# Patient Record
Sex: Male | Born: 2000 | Race: White | Hispanic: No | Marital: Single | Smoking: Never smoker
Health system: Southern US, Community
[De-identification: ages and names within clinical notes are randomized; demographics above are authoritative.]

## PROBLEM LIST (undated history)

## (undated) DIAGNOSIS — J302 Other seasonal allergic rhinitis: Secondary | ICD-10-CM

## (undated) HISTORY — PX: WISDOM TOOTH EXTRACTION: SHX21

## (undated) HISTORY — DX: Other seasonal allergic rhinitis: J30.2

---
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
## 2020-12-17 DIAGNOSIS — U071 COVID-19: Secondary | ICD-10-CM

## 2020-12-17 HISTORY — DX: COVID-19: U07.1

## 2021-01-02 ENCOUNTER — Telehealth: Payer: Self-pay | Admitting: Family Medicine

## 2021-01-02 NOTE — Telephone Encounter (Signed)
Patients father called and asked for him to be established with DR G. / Dr Darnell Level agreed to see him. EM

## 2021-01-03 DIAGNOSIS — T148XXA Other injury of unspecified body region, initial encounter: Secondary | ICD-10-CM | POA: Diagnosis not present

## 2021-01-03 DIAGNOSIS — L089 Local infection of the skin and subcutaneous tissue, unspecified: Secondary | ICD-10-CM | POA: Diagnosis not present

## 2021-01-05 DIAGNOSIS — L03211 Cellulitis of face: Secondary | ICD-10-CM | POA: Diagnosis not present

## 2021-01-10 DIAGNOSIS — U071 COVID-19: Secondary | ICD-10-CM | POA: Diagnosis not present

## 2021-01-30 ENCOUNTER — Other Ambulatory Visit: Payer: Self-pay

## 2021-01-30 ENCOUNTER — Ambulatory Visit (INDEPENDENT_AMBULATORY_CARE_PROVIDER_SITE_OTHER): Payer: BC Managed Care – PPO | Admitting: Family Medicine

## 2021-01-30 ENCOUNTER — Encounter: Payer: Self-pay | Admitting: Family Medicine

## 2021-01-30 DIAGNOSIS — D229 Melanocytic nevi, unspecified: Secondary | ICD-10-CM

## 2021-01-30 DIAGNOSIS — L03211 Cellulitis of face: Secondary | ICD-10-CM

## 2021-01-30 DIAGNOSIS — E669 Obesity, unspecified: Secondary | ICD-10-CM | POA: Diagnosis not present

## 2021-01-30 DIAGNOSIS — R21 Rash and other nonspecific skin eruption: Secondary | ICD-10-CM | POA: Diagnosis not present

## 2021-01-30 NOTE — Patient Instructions (Signed)
For rash to face - suspicious for seborrheic dermatitis - treat with cortizone-10 steroid topically once daily for max 10 days and may use clotrimazole (lotrimin) twice daily as needed on face around nose.  May continue head&shoulders or selsun blue or nizoral shampoo for scalp.  Congratulations on weight loss! Keep up the good work!  For bump next to eye, use warm compresses - likely just scar nodule that should get better with time.  Return for fasting labs and afterwards for physical over next 1-2 months.

## 2021-01-30 NOTE — Progress Notes (Signed)
Patient ID: Matthew Friedman, male    DOB: 2000/12/15, 20 y.o.   MRN: [id number]  This visit was conducted in person.  BP 110/64 (BP Location: Left Arm, Patient Position: Sitting, Cuff Size: Large)   Pulse 80   Temp 98.1 F (36.7 C) (Temporal)   Ht 5\' 8"  (1.727 m)   Wt 225 lb 6 oz (102.2 kg)   SpO2 97%   BMI 34.27 kg/m    CC: new pt to establish care Subjective:   HPI: Matthew Friedman is a 20 y.o. male presenting on 01/30/2021 for Establish Care   Recently seen at [facility] for left facial cellulitis laterally near eye. Treated with doxycycline 100mg  bid 10d course and mupirocin. Symptoms have improved however notes ongoing induration to skin at lateral eye.   COVID infection late last month, treated with paxlovid antiviral med. Symptoms fully resolved.   Weight loss - has lost 13 lbs this summer. Healthy diet changes (cut out sweet tea), using apple watch for goal >10k steps/day.   New freckle on nose - planning to see derm.  H/o seborrheic dermatitis around nose and to scalp - previously treated with ?ketoconazole shampoo with benefit. Currently using equate brand head and shoulders anti-dandruff shampoo.   No recent CPE.  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 Interim medical history since our last visit reviewed. Allergies and medications reviewed and updated. Outpatient Medications Prior to Visit  Medication Sig Dispense Refill   Multiple Vitamin (MULTIVITAMIN) capsule Take 1 capsule by mouth daily.     No facility-administered medications prior to visit.     Per HPI unless specifically indicated in ROS section below Review of Systems  Objective:  BP 110/64 (BP Location:  Left Arm, Patient Position: Sitting, Cuff Size: Large)   Pulse 80   Temp 98.1 F (36.7 C) (Temporal)   Ht 5\' 8"  (1.727 m)   Wt 225 lb 6 oz (102.2 kg)   SpO2 97%   BMI 34.27 kg/m   Wt Readings from Last 3 Encounters:  01/30/21 225 lb 6 oz (102.2 kg)      Physical Exam Vitals and nursing note reviewed.  Constitutional:      Appearance: Normal appearance. He is obese. He is not ill-appearing.  HENT:     Head: Normocephalic and atraumatic.  Eyes:     General: No scleral icterus.       Right eye: No discharge.        Left eye: No discharge.     Extraocular Movements: Extraocular movements intact.     Conjunctiva/sclera: Conjunctivae normal.     Pupils: Pupils are equal, round, and reactive to light.     Comments: Small nodule lateral to L eye  Cardiovascular:     Rate and Rhythm: Normal rate and regular rhythm.     Pulses: Normal pulses.     Heart sounds: Normal heart sounds. No murmur heard. Pulmonary:     Effort: Pulmonary effort is normal. No respiratory distress.     Breath sounds: Normal breath sounds. No wheezing, rhonchi or rales.  Musculoskeletal:        General: Normal range of motion.     Right lower leg: No edema.  Left lower leg: No edema.  Skin:    General: Skin is warm and dry.     Capillary Refill: Capillary refill takes less than 2 seconds.     Findings: Erythema and rash present. Rash is scaling.     Comments:  Scaling erythema to midface around nose  Few typical nevi on face including nose, one darker nevus to R cheek  Neurological:     Mental Status: He is alert.  Psychiatric:        Mood and Affect: Mood normal.        Behavior: Behavior normal.      Results for orders placed or performed in visit on 05/24/19  Novel Coronavirus, NAA ([facility])   Specimen: Oropharyngeal(OP) collection in vial transport medium   OROPHARYNGEA  TESTING  Result Value Ref Range   SARS-CoV-2, NAA Not Detected Not Detected    Assessment & Plan:  This visit  occurred during the SARS-CoV-2 public health emergency.  Safety protocols were in place, including screening questions prior to the visit, additional usage of staff PPE, and extensive cleaning of exam room while observing appropriate contact time as indicated for disinfecting solutions.   Problem List Items Addressed This Visit     Rash of face    Facial rash suspicious for seborrheic dermatitis.  Discussed home treatment - rec start withtopical OTC cortizone-10 once daily x10 days max (Discussed steroid precautions) along with lotrimin antifungal cream BID PRN. Discussed maintenance use of selsun blue or other anti-dandruff shampoo. He does have upcoming dermatology eval.        Multiple nevi    Multiple typical appearing nevi on face, one darker pigmented nevus R cheek - advised he check with dermatologist.        Obesity, Class II, BMI 35-39.9, no comorbidity    Reviewed healthy diet and lifestyle choices he has started implementing - congratulated on 13 lb weight loss to date, encouraged continued weight loss efforts through small steps he can build on. He is motivated to continue this.        Facial cellulitis    Lateral to left eye - significantly improved. Discussed warm compresses to small residual inflammatory nodule. No signs of ongoing infection.          Meds ordered this encounter  Medications   Hydrocortisone-Aloe Vera (CORTIZONE 10 PLUS) 1 % CREA    Sig: Apply to affected area on face once daily for 7-10 days at a time    Dispense:  14 g    Refill:  0   clotrimazole (LOTRIMIN AF) 1 % cream    Sig: Apply 1 application topically 2 (two) times daily.    Dispense:  30 g    Refill:  0    No orders of the defined types were placed in this encounter.   Patient Instructions  For rash to face - suspicious for seborrheic dermatitis - treat with cortizone-10 steroid topically once daily for max 10 days and may use clotrimazole (lotrimin) twice daily as needed on face  around nose.  May continue head&shoulders or selsun blue or nizoral shampoo for scalp.  Congratulations on weight loss! Keep up the good work!  For bump next to eye, use warm compresses - likely just scar nodule that should get better with time.  Return for fasting labs and afterwards for physical over next 1-2 months.   Follow up plan: Return in about 6 weeks (around 03/13/2021), or if symptoms worsen or fail to improve, for  annual exam, prior fasting for blood work.  Ria Bush, MD

## 2021-01-31 ENCOUNTER — Encounter: Payer: Self-pay | Admitting: Family Medicine

## 2021-01-31 DIAGNOSIS — L219 Seborrheic dermatitis, unspecified: Secondary | ICD-10-CM | POA: Insufficient documentation

## 2021-01-31 DIAGNOSIS — L03211 Cellulitis of face: Secondary | ICD-10-CM | POA: Insufficient documentation

## 2021-01-31 DIAGNOSIS — E669 Obesity, unspecified: Secondary | ICD-10-CM | POA: Insufficient documentation

## 2021-01-31 DIAGNOSIS — D229 Melanocytic nevi, unspecified: Secondary | ICD-10-CM | POA: Insufficient documentation

## 2021-01-31 DIAGNOSIS — R21 Rash and other nonspecific skin eruption: Secondary | ICD-10-CM | POA: Insufficient documentation

## 2021-01-31 MED ORDER — CLOTRIMAZOLE 1 % EX CREA: 1.0000 "application " | TOPICAL_CREAM | Freq: Two times a day (BID) | CUTANEOUS | 0 refills | Status: AC

## 2021-01-31 MED ORDER — CORTIZONE-10 PLUS 1 % EX CREA: TOPICAL_CREAM | CUTANEOUS | 0 refills | Status: AC

## 2021-01-31 NOTE — Assessment & Plan Note (Signed)
Multiple typical appearing nevi on face, one darker pigmented nevus R cheek - advised he check with dermatologist.

## 2021-01-31 NOTE — Assessment & Plan Note (Addendum)
Facial rash suspicious for seborrheic dermatitis.  Discussed home treatment - rec start withtopical OTC cortizone-10 once daily x10 days max (Discussed steroid precautions) along with lotrimin antifungal cream BID PRN. Discussed maintenance use of selsun blue or other anti-dandruff shampoo. He does have upcoming dermatology eval.

## 2021-01-31 NOTE — Assessment & Plan Note (Signed)
Reviewed healthy diet and lifestyle choices he has started implementing - congratulated on 13 lb weight loss to date, encouraged continued weight loss efforts through small steps he can build on. He is motivated to continue this.

## 2021-01-31 NOTE — Assessment & Plan Note (Signed)
Lateral to left eye - significantly improved. Discussed warm compresses to small residual inflammatory nodule. No signs of ongoing infection.

## 2021-03-01 ENCOUNTER — Other Ambulatory Visit: Payer: Self-pay | Admitting: Family Medicine

## 2021-03-01 DIAGNOSIS — Z131 Encounter for screening for diabetes mellitus: Secondary | ICD-10-CM

## 2021-03-01 DIAGNOSIS — Z13220 Encounter for screening for lipoid disorders: Secondary | ICD-10-CM

## 2021-03-06 ENCOUNTER — Other Ambulatory Visit (INDEPENDENT_AMBULATORY_CARE_PROVIDER_SITE_OTHER): Payer: BC Managed Care – PPO

## 2021-03-06 ENCOUNTER — Other Ambulatory Visit: Payer: Self-pay

## 2021-03-06 DIAGNOSIS — Z131 Encounter for screening for diabetes mellitus: Secondary | ICD-10-CM

## 2021-03-06 DIAGNOSIS — Z13220 Encounter for screening for lipoid disorders: Secondary | ICD-10-CM

## 2021-03-06 LAB — BASIC METABOLIC PANEL
BUN: 15 mg/dL (ref 6–23)
CO2: 26 mEq/L (ref 19–32)
Calcium: 9.5 mg/dL (ref 8.4–10.5)
Chloride: 106 mEq/L (ref 96–112)
Creatinine, Ser: 0.83 mg/dL (ref 0.40–1.50)
GFR: 126 mL/min (ref >60.00)
Glucose, Bld: 85 mg/dL (ref 70–99)
Potassium: 4.5 mEq/L (ref 3.5–5.1)
Sodium: 140 mEq/L (ref 135–145)

## 2021-03-06 LAB — LIPID PANEL
Cholesterol: 155 mg/dL (ref 0–200)
HDL: 41.3 mg/dL (ref >39.00)
LDL Cholesterol: 92 mg/dL (ref 0–99)
NonHDL: 113.77
Total CHOL/HDL Ratio: 4
Triglycerides: 111 mg/dL (ref 0.0–149.0)
VLDL: 22.2 mg/dL (ref 0.0–40.0)

## 2021-03-13 ENCOUNTER — Encounter: Payer: Self-pay | Admitting: Family Medicine

## 2021-03-13 ENCOUNTER — Ambulatory Visit (INDEPENDENT_AMBULATORY_CARE_PROVIDER_SITE_OTHER): Payer: BC Managed Care – PPO | Admitting: Family Medicine

## 2021-03-13 ENCOUNTER — Other Ambulatory Visit: Payer: Self-pay

## 2021-03-13 VITALS — BP 112/76 | HR 89 | Temp 98.0°F | Ht 68.0 in | Wt 215.9 lb

## 2021-03-13 DIAGNOSIS — L219 Seborrheic dermatitis, unspecified: Secondary | ICD-10-CM

## 2021-03-13 DIAGNOSIS — E669 Obesity, unspecified: Secondary | ICD-10-CM | POA: Diagnosis not present

## 2021-03-13 DIAGNOSIS — Z23 Encounter for immunization: Secondary | ICD-10-CM | POA: Diagnosis not present

## 2021-03-13 DIAGNOSIS — Z0000 Encounter for general adult medical examination without abnormal findings: Secondary | ICD-10-CM | POA: Insufficient documentation

## 2021-03-13 NOTE — Progress Notes (Signed)
Patient ID: Matthew Friedman, male    DOB: 2000-09-12, 20 y.o.   MRN: [id number]  This visit was conducted in person.  BP 112/76   Pulse 89   Temp 98 F (36.7 C) (Temporal)   Ht '5\' 8"'$  (1.727 m)   Wt 215 lb 14.4 oz (97.9 kg)   SpO2 98%   BMI 32.83 kg/m    CC: CPE Subjective:   HPI: Matthew Friedman is a 20 y.o. male presenting on 03/13/2021 for Annual Exam   10 lb weight loss in the past month! Notes increased walking since he's transitioned to college [name] Was working at [name] over the summer. Living on campus housing (apartment), has meal plan. Using My[employer]itness[employer] app since the beginning of summer. Total 25 lbs weight loss over this past summer. Works for campus housing.   Recent facial cellulitis treated with doxycycline with benefit.  Seborrheic dermatitis-  treating with OTC cortizone-10 daily for 10 days then lotrimin bid prn. Maintenance selsun blue. This is working well.   Planning study abroad next semester.   Preventative: Flu shot - hasn't received  [name] 09/2019, 10/2019, booster 05/2020  Tdap 2012. Update today  Seat belt use discussed Sunscreen use discussed. No changing moles on skin. 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 Interim medical history since our last visit reviewed. Allergies and medications reviewed and updated. Outpatient Medications Prior to Visit  Medication Sig Dispense Refill   clotrimazole (LOTRIMIN AF) 1 % cream Apply 1 application topically 2 (two) times daily. 30 g 0   Hydrocortisone-Aloe Vera (CORTIZONE 10 PLUS) 1 % CREA Apply to affected area  on face once daily for 7-10 days at a time 14 g 0   Multiple Vitamin (MULTIVITAMIN) capsule Take 1 capsule by mouth daily.     No facility-administered medications prior to visit.     Per HPI unless specifically indicated in ROS section below Review of Systems  Constitutional:  Negative for activity change, appetite change, chills, fatigue, fever and unexpected weight change.  HENT:  Negative for hearing loss.   Eyes:  Negative for visual disturbance.  Respiratory:  Negative for cough, chest tightness, shortness of breath and wheezing.   Cardiovascular:  Negative for chest pain, palpitations and leg swelling.  Gastrointestinal:  Negative for abdominal distention, abdominal pain, blood in stool, constipation, diarrhea, nausea and vomiting.  Genitourinary:  Negative for difficulty urinating and hematuria.  Musculoskeletal:  Negative for arthralgias, myalgias and neck pain.  Skin:  Negative for rash.  Neurological:  Negative for dizziness, seizures, syncope and headaches.  Hematological:  Negative for adenopathy. Does not bruise/bleed easily.  Psychiatric/Behavioral:  Negative for dysphoric mood. The patient is not nervous/anxious.    Objective:  BP 112/76   Pulse 89   Temp 98 F (36.7 C) (Temporal)   Ht '5\' 8"'$  (1.727 m)   Wt 215 lb 14.4 oz (97.9 kg)   SpO2 98%   BMI 32.83 kg/m   Wt Readings from Last 3 Encounters:  03/13/21 215 lb 14.4 oz (97.9 kg)  01/30/21 225 lb 6 oz (102.2 kg)  Physical Exam Vitals and nursing note reviewed.  Constitutional:      General: He is not in acute distress.    Appearance: Normal appearance. He is well-developed. He is not ill-appearing.  HENT:     Head: Normocephalic and atraumatic.     Right Ear: Hearing, tympanic membrane, ear canal and external ear normal.     Left Ear: Hearing, tympanic membrane, ear canal and external ear normal.  Eyes:     General: No scleral icterus.    Extraocular Movements: Extraocular movements intact.      Conjunctiva/sclera: Conjunctivae normal.     Pupils: Pupils are equal, round, and reactive to light.  Neck:     Thyroid: No thyroid mass or thyromegaly.  Cardiovascular:     Rate and Rhythm: Normal rate and regular rhythm.     Pulses: Normal pulses.          Radial pulses are 2+ on the right side and 2+ on the left side.     Heart sounds: Normal heart sounds. No murmur heard. Pulmonary:     Effort: Pulmonary effort is normal. No respiratory distress.     Breath sounds: Normal breath sounds. No wheezing, rhonchi or rales.  Abdominal:     General: Bowel sounds are normal. There is no distension.     Palpations: Abdomen is soft. There is no mass.     Tenderness: no abdominal tenderness There is no guarding or rebound.     Hernia: No hernia is present.  Musculoskeletal:        General: Normal range of motion.     Cervical back: Normal range of motion and neck supple.     Right lower leg: No edema.     Left lower leg: No edema.  Lymphadenopathy:     Cervical: No cervical adenopathy.  Skin:    General: Skin is warm and dry.     Findings: No rash.  Neurological:     General: No focal deficit present.     Mental Status: He is alert and oriented to person, place, and time.  Psychiatric:        Mood and Affect: Mood normal.        Behavior: Behavior normal.        Thought Content: Thought content normal.        Judgment: Judgment normal.      Results for orders placed or performed in visit on 123456  Basic metabolic panel  Result Value Ref Range   Sodium 140 135 - 145 mEq/L   Potassium 4.5 3.5 - 5.1 mEq/L   Chloride 106 96 - 112 mEq/L   CO2 26 19 - 32 mEq/L   Glucose, Bld 85 70 - 99 mg/dL   BUN 15 6 - 23 mg/dL   Creatinine, Ser 0.83 0.40 - 1.50 mg/dL   GFR 126.00 >60.00 mL/min   Calcium 9.5 8.4 - 10.5 mg/dL  Lipid panel  Result Value Ref Range   Cholesterol 155 0 - 200 mg/dL   Triglycerides 111.0 0.0 - 149.0 mg/dL   HDL 41.30 >39.00 mg/dL   VLDL 22.2 0.0 - 40.0 mg/dL    LDL Cholesterol 92 0 - 99 mg/dL   Total CHOL/HDL Ratio 4    NonHDL 113.77     Assessment & Plan:  This visit occurred during the SARS-CoV-2 public health emergency.  Safety protocols were in place, including screening questions prior to the visit, additional usage of staff PPE, and extensive cleaning of exam room  while observing appropriate contact time as indicated for disinfecting solutions.   Problem List Items Addressed This Visit     Seborrheic dermatitis    Good response to cortizone-10 + lotrimin. Discussed very sparing topical steroid on face and side effects to watch for. Use lotrimin or selsun blue for maintenance.       Obesity, Class I, BMI 30-34.9    Congratulated on weight loss to date.  Pt motivated to continue healthy diet and lifestyle choices for sustainable weight loss.       Health maintenance examination - Primary    Preventative protocols reviewed and updated unless pt declined. Discussed healthy diet and lifestyle.       Other Visit Diagnoses     Need for Tdap vaccination       Relevant Orders   Tdap vaccine greater than or equal to 7yo IM (Completed)        No orders of the defined types were placed in this encounter.  Orders Placed This Encounter  Procedures   Tdap vaccine greater than or equal to 7yo IM     Patient instructions: Use steroid (cortizone-10) sparingly. May use lotrimin or selsun blue shampoo as maintenance for seborrheic dermatitis.  You are doing well today - congratulations on weight loss! Return as needed or in 1-2 year for next physical.   Follow up plan: Return in about 1 year (around 03/13/2022) for annual exam, prior fasting for blood work.  Ria Bush, MD

## 2021-03-13 NOTE — Assessment & Plan Note (Signed)
Preventative protocols reviewed and updated unless pt declined. Discussed healthy diet and lifestyle.  

## 2021-03-13 NOTE — Assessment & Plan Note (Signed)
Congratulated on weight loss to date.  Pt motivated to continue healthy diet and lifestyle choices for sustainable weight loss.

## 2021-03-13 NOTE — Patient Instructions (Signed)
Use steroid (cortizone-10) sparingly. May use lotrimin or selsun blue shampoo as maintenance for seborrheic dermatitis.  You are doing well today - congratulations on weight loss! Return as needed or in 1-2 year for next physical.   Preventive Care 20-20 Years Old, Male Preventive care refers to lifestyle choices and visits with your health care provider that can promote health and wellness. At this stage in your life, you may start seeing a primary care physician instead of a pediatrician. It is important to take responsibility for your health and well-being. Preventive care for young adults includes: A yearly physical exam. This is also called an annual wellness visit. Regular dental and eye exams. Immunizations. Screening for certain conditions. Healthy lifestyle choices, such as: Eating a healthy diet. Getting regular exercise. Not using drugs or products that contain nicotine and tobacco. Limiting alcohol use. What can I expect for my preventive care visit? Physical exam Your health care provider may check your: Height and weight. These may be used to calculate your BMI (body mass index). BMI is a measurement that tells if you are at a healthy weight. Heart rate and blood pressure. Body temperature. Skin for abnormal spots. Counseling Your health care provider may ask you questions about your: Past medical problems. Family's medical history. Alcohol, tobacco, and drug use. Home life and relationship well-being. Access to firearms. Emotional well-being. Diet, exercise, and sleep habits. Sexual activity and sexual health. What immunizations do I need?  Vaccines are usually given at various ages, according to a schedule. Your health care provider will recommend vaccines for you based on your age, medicalhistory, and lifestyle or other factors, such as travel or where you work. What tests do I need? Blood tests Lipid and cholesterol levels. These may be checked every 5 years  starting at age 20. Hepatitis C test. Hepatitis B test. Screening Genital exam to check for testicular cancer or hernias. STD (sexually transmitted disease) testing, if you are at risk. Other tests Tuberculosis skin test. Vision and hearing tests. Skin exam. Talk with your health care provider about your test results, treatment options,and if necessary, the need for more tests. Follow these instructions at home: Eating and drinking  Eat a healthy diet that includes fresh fruits and vegetables, whole grains, lean protein, and low-fat dairy products. Drink enough fluid to keep your urine pale yellow. Do not drink alcohol if: Your health care provider tells you not to drink. You are under the legal drinking age. In the [place] the legal drinking age is 20. If you drink alcohol: Limit how much you use to 0-2 drinks a day. Be aware of how much alcohol is in your drink. In the [place] one drink equals one 12 oz bottle of beer (355 mL), one 5 oz glass of wine (148 mL), or one 1 oz glass of hard liquor (44 mL).  Lifestyle Take daily care of your teeth and gums. Brush your teeth every morning and night with fluoride toothpaste. Floss one time each day. Stay active. Exercise for at least 30 minutes 5 or more days of the week. Do not use any products that contain nicotine or tobacco, such as cigarettes, e-cigarettes, and chewing tobacco. If you need help quitting, ask your health care provider. Do not use drugs. If you are sexually active, practice safe sex. Use a condom or other form of protection to prevent STIs (sexually transmitted infections). Find healthy ways to cope with stress, such as: Meditation, yoga, or listening to music. Journaling. Talking to a  trusted person. Spending time with friends and family. Safety Always wear your seat belt while driving or riding in a vehicle. Do not drive: If you have been drinking alcohol. Do not ride with someone who has been drinking. When you are  tired or distracted. While texting. Wear a helmet and other protective equipment during sports activities. If you have firearms in your house, make sure you follow all gun safety procedures. Seek help if you have been bullied, physically abused, or sexually abused. Use the Internet responsibly to avoid dangers, such as online bullying and online sex predators. What's next? Go to your health care provider once a year for an annual wellness visit. Ask your health care provider how often you should have your eyes and teeth checked. Stay up to date on all vaccines. This information is not intended to replace advice given to you by your health care provider. Make sure you discuss any questions you have with your healthcare provider. Document Revised: 03/21/2019 Document Reviewed: 06/29/2018 Elsevier Patient Education  2022 [employer].

## 2021-03-13 NOTE — Assessment & Plan Note (Signed)
Good response to cortizone-10 + lotrimin. Discussed very sparing topical steroid on face and side effects to watch for. Use lotrimin or selsun blue for maintenance.

## 2021-05-08 DIAGNOSIS — R41840 Attention and concentration deficit: Secondary | ICD-10-CM | POA: Diagnosis not present

## 2022-03-06 ENCOUNTER — Other Ambulatory Visit: Payer: Self-pay | Admitting: Family Medicine

## 2022-03-06 DIAGNOSIS — E66811 Obesity, class 1: Secondary | ICD-10-CM

## 2022-03-06 DIAGNOSIS — E669 Obesity, unspecified: Secondary | ICD-10-CM

## 2022-03-06 DIAGNOSIS — Z1159 Encounter for screening for other viral diseases: Secondary | ICD-10-CM

## 2022-03-11 ENCOUNTER — Other Ambulatory Visit: Payer: BC Managed Care – PPO

## 2022-03-15 ENCOUNTER — Encounter: Payer: BC Managed Care – PPO | Admitting: Family Medicine

## 2022-04-30 ENCOUNTER — Telehealth: Payer: Self-pay | Admitting: Family Medicine

## 2022-04-30 DIAGNOSIS — Z0279 Encounter for issue of other medical certificate: Secondary | ICD-10-CM

## 2022-04-30 NOTE — Telephone Encounter (Signed)
Dad brought by form for [place] travel "Statement of Physician" Form filled and in Lisa's box.  Plz make 1 copy for [place] and notify dad ready to pick up.

## 2022-05-03 NOTE — Telephone Encounter (Signed)
Spoke with pt notifying him the form is ready to pick up and there is a $5.00 fee for having it completed.  Pt verbalizes understanding and expresses his thanks.   [Placed form at front office.  Made copy to scan and 1 for billing.]
